# Patient Record
Sex: Female | Born: 1945 | Race: White | Hispanic: No | Smoking: Former smoker
Health system: Southern US, Community
[De-identification: ages and names within clinical notes are randomized; demographics above are authoritative.]

## PROBLEM LIST (undated history)

## (undated) DIAGNOSIS — K5903 Drug induced constipation: Secondary | ICD-10-CM

## (undated) DIAGNOSIS — K219 Gastro-esophageal reflux disease without esophagitis: Secondary | ICD-10-CM

## (undated) DIAGNOSIS — J449 Chronic obstructive pulmonary disease, unspecified: Secondary | ICD-10-CM

## (undated) DIAGNOSIS — R51 Headache: Secondary | ICD-10-CM

## (undated) DIAGNOSIS — F329 Major depressive disorder, single episode, unspecified: Secondary | ICD-10-CM

## (undated) DIAGNOSIS — F419 Anxiety disorder, unspecified: Secondary | ICD-10-CM

## (undated) DIAGNOSIS — I10 Essential (primary) hypertension: Secondary | ICD-10-CM

## (undated) DIAGNOSIS — D649 Anemia, unspecified: Secondary | ICD-10-CM

## (undated) DIAGNOSIS — R002 Palpitations: Secondary | ICD-10-CM

## (undated) DIAGNOSIS — Z9889 Other specified postprocedural states: Secondary | ICD-10-CM

## (undated) DIAGNOSIS — G4730 Sleep apnea, unspecified: Secondary | ICD-10-CM

## (undated) DIAGNOSIS — R1310 Dysphagia, unspecified: Secondary | ICD-10-CM

## (undated) DIAGNOSIS — M1990 Unspecified osteoarthritis, unspecified site: Secondary | ICD-10-CM

## (undated) DIAGNOSIS — R112 Nausea with vomiting, unspecified: Secondary | ICD-10-CM

## (undated) DIAGNOSIS — R519 Headache, unspecified: Secondary | ICD-10-CM

## (undated) DIAGNOSIS — Z8719 Personal history of other diseases of the digestive system: Secondary | ICD-10-CM

## (undated) DIAGNOSIS — H4450 Unspecified degenerated conditions of globe: Secondary | ICD-10-CM

## (undated) DIAGNOSIS — M797 Fibromyalgia: Secondary | ICD-10-CM

## (undated) DIAGNOSIS — G629 Polyneuropathy, unspecified: Secondary | ICD-10-CM

## (undated) DIAGNOSIS — R682 Dry mouth, unspecified: Secondary | ICD-10-CM

## (undated) DIAGNOSIS — H269 Unspecified cataract: Secondary | ICD-10-CM

## (undated) DIAGNOSIS — F32A Depression, unspecified: Secondary | ICD-10-CM

## (undated) HISTORY — PX: COLONOSCOPY: SHX174

## (undated) HISTORY — PX: ESOPHAGOGASTRODUODENOSCOPY ENDOSCOPY: SHX5814

## (undated) HISTORY — PX: ABDOMINAL HYSTERECTOMY: SHX81

## (undated) HISTORY — PX: CARDIAC CATHETERIZATION: SHX172

## (undated) HISTORY — PX: SINUS IRRIGATION: SHX2411

## (undated) HISTORY — PX: TONSILLECTOMY AND ADENOIDECTOMY: SUR1326

---

## 2015-09-04 ENCOUNTER — Encounter (HOSPITAL_COMMUNITY): Payer: Self-pay | Admitting: Emergency Medicine

## 2015-09-04 ENCOUNTER — Emergency Department (HOSPITAL_COMMUNITY)
Admission: EM | Admit: 2015-09-04 | Discharge: 2015-09-04 | Disposition: A | Discharge status: Home / Self Care | Payer: No Typology Code available for payment source | Attending: Emergency Medicine | Admitting: Emergency Medicine

## 2015-09-04 ENCOUNTER — Emergency Department (HOSPITAL_COMMUNITY): Payer: No Typology Code available for payment source

## 2015-09-04 DIAGNOSIS — I10 Essential (primary) hypertension: Secondary | ICD-10-CM | POA: Diagnosis not present

## 2015-09-04 DIAGNOSIS — S42351A Displaced comminuted fracture of shaft of humerus, right arm, initial encounter for closed fracture: Secondary | ICD-10-CM | POA: Insufficient documentation

## 2015-09-04 DIAGNOSIS — Z7951 Long term (current) use of inhaled steroids: Secondary | ICD-10-CM | POA: Insufficient documentation

## 2015-09-04 DIAGNOSIS — S0990XA Unspecified injury of head, initial encounter: Secondary | ICD-10-CM | POA: Diagnosis not present

## 2015-09-04 DIAGNOSIS — S42301A Unspecified fracture of shaft of humerus, right arm, initial encounter for closed fracture: Secondary | ICD-10-CM

## 2015-09-04 DIAGNOSIS — R109 Unspecified abdominal pain: Secondary | ICD-10-CM

## 2015-09-04 DIAGNOSIS — Y998 Other external cause status: Secondary | ICD-10-CM | POA: Insufficient documentation

## 2015-09-04 DIAGNOSIS — J449 Chronic obstructive pulmonary disease, unspecified: Secondary | ICD-10-CM | POA: Diagnosis not present

## 2015-09-04 DIAGNOSIS — T1490XA Injury, unspecified, initial encounter: Secondary | ICD-10-CM

## 2015-09-04 DIAGNOSIS — Z91040 Latex allergy status: Secondary | ICD-10-CM | POA: Insufficient documentation

## 2015-09-04 DIAGNOSIS — S79911A Unspecified injury of right hip, initial encounter: Secondary | ICD-10-CM | POA: Diagnosis not present

## 2015-09-04 DIAGNOSIS — S4991XA Unspecified injury of right shoulder and upper arm, initial encounter: Secondary | ICD-10-CM | POA: Diagnosis present

## 2015-09-04 DIAGNOSIS — Z79899 Other long term (current) drug therapy: Secondary | ICD-10-CM | POA: Insufficient documentation

## 2015-09-04 DIAGNOSIS — Y92410 Unspecified street and highway as the place of occurrence of the external cause: Secondary | ICD-10-CM | POA: Insufficient documentation

## 2015-09-04 DIAGNOSIS — Z8669 Personal history of other diseases of the nervous system and sense organs: Secondary | ICD-10-CM | POA: Diagnosis not present

## 2015-09-04 DIAGNOSIS — Y9389 Activity, other specified: Secondary | ICD-10-CM | POA: Diagnosis not present

## 2015-09-04 DIAGNOSIS — M25551 Pain in right hip: Secondary | ICD-10-CM

## 2015-09-04 HISTORY — DX: Sleep apnea, unspecified: G47.30

## 2015-09-04 HISTORY — DX: Chronic obstructive pulmonary disease, unspecified: J44.9

## 2015-09-04 HISTORY — DX: Essential (primary) hypertension: I10

## 2015-09-04 HISTORY — DX: Polyneuropathy, unspecified: G62.9

## 2015-09-04 LAB — COMPREHENSIVE METABOLIC PANEL
ALT: 18 U/L (ref 14–54)
AST: 25 U/L (ref 15–41)
Albumin: 3.6 g/dL (ref 3.5–5.0)
Alkaline Phosphatase: 81 U/L (ref 38–126)
Anion gap: 10 (ref 5–15)
BUN: 16 mg/dL (ref 6–20)
CO2: 23 mmol/L (ref 22–32)
Calcium: 9.8 mg/dL (ref 8.9–10.3)
Chloride: 106 mmol/L (ref 101–111)
Creatinine, Ser: 0.55 mg/dL (ref 0.44–1.00)
GFR calc Af Amer: >60 mL/min (ref >60)
GFR calc non Af Amer: >60 mL/min (ref >60)
Glucose, Bld: 113 mg/dL — ABNORMAL HIGH (ref 65–99)
Potassium: 3.9 mmol/L (ref 3.5–5.1)
Sodium: 139 mmol/L (ref 135–145)
Total Bilirubin: 0.5 mg/dL (ref 0.3–1.2)
Total Protein: 6.1 g/dL — ABNORMAL LOW (ref 6.5–8.1)

## 2015-09-04 LAB — CBC
HCT: 38.4 % (ref 36.0–46.0)
Hemoglobin: 13 g/dL (ref 12.0–15.0)
MCH: 30.1 pg (ref 26.0–34.0)
MCHC: 33.9 g/dL (ref 30.0–36.0)
MCV: 88.9 fL (ref 78.0–100.0)
Platelets: 220 10*3/uL (ref 150–400)
RBC: 4.32 MIL/uL (ref 3.87–5.11)
RDW: 12.4 % (ref 11.5–15.5)
WBC: 13.6 10*3/uL — ABNORMAL HIGH (ref 4.0–10.5)

## 2015-09-04 LAB — CDS SEROLOGY

## 2015-09-04 LAB — PROTIME-INR
INR: 1.03 (ref 0.00–1.49)
Prothrombin Time: 13.7 seconds (ref 11.6–15.2)

## 2015-09-04 LAB — ETHANOL: Alcohol, Ethyl (B): <5 mg/dL (ref <5)

## 2015-09-04 LAB — SAMPLE TO BLOOD BANK

## 2015-09-04 MED ORDER — FENTANYL CITRATE (PF) 100 MCG/2ML IJ SOLN
50.0000 ug | Freq: Once | INTRAMUSCULAR | Status: AC
  Administered 2015-09-04: 50 ug via INTRAVENOUS
  Filled 2015-09-04: qty 2

## 2015-09-04 MED ORDER — IOHEXOL 300 MG/ML  SOLN
100.0000 mL | Freq: Once | INTRAMUSCULAR | Status: AC | PRN
  Administered 2015-09-04: 100 mL via INTRAVENOUS

## 2015-09-04 MED ORDER — MORPHINE SULFATE (PF) 2 MG/ML IV SOLN
2.0000 mg | Freq: Once | INTRAVENOUS | Status: AC
  Administered 2015-09-04: 2 mg via INTRAVENOUS
  Filled 2015-09-04: qty 1

## 2015-09-04 MED ORDER — ONDANSETRON HCL 4 MG/2ML IJ SOLN
4.0000 mg | Freq: Once | INTRAMUSCULAR | Status: AC
  Administered 2015-09-04: 4 mg via INTRAVENOUS
  Filled 2015-09-04: qty 2

## 2015-09-04 MED ORDER — HYDROCODONE-ACETAMINOPHEN 5-325 MG PO TABS: 1.0000 | ORAL_TABLET | ORAL | Status: AC | PRN

## 2015-09-04 NOTE — Discharge Instructions (Signed)
Please use medication as directed. Please call Dr. Roda ShuttersXu Monday morning for follow-up evaluation and further management. If concerning signs or symptoms present please return to the ED for further evaluation and management.

## 2015-09-04 NOTE — ED Notes (Signed)
Patient transported to CT 

## 2015-09-04 NOTE — ED Notes (Signed)
PA Hedges at bedside to assess patient.
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
## 2015-09-04 NOTE — ED Notes (Signed)
Patient ambulated around room. Pt tolerated ambulation well, appeared steady on her feet.

## 2015-09-05 ENCOUNTER — Other Ambulatory Visit: Payer: Self-pay | Admitting: Orthopaedic Surgery

## 2015-09-05 ENCOUNTER — Ambulatory Visit
Admission: RE | Admit: 2015-09-05 | Discharge: 2015-09-05 | Disposition: A | Discharge status: Home / Self Care | Payer: Medicare Other | Source: Ambulatory Visit | Attending: Orthopaedic Surgery | Admitting: Orthopaedic Surgery

## 2015-09-05 ENCOUNTER — Other Ambulatory Visit (HOSPITAL_COMMUNITY): Payer: Self-pay | Admitting: Orthopaedic Surgery

## 2015-09-05 DIAGNOSIS — M25511 Pain in right shoulder: Secondary | ICD-10-CM

## 2015-09-06 ENCOUNTER — Encounter (HOSPITAL_COMMUNITY): Payer: Self-pay | Admitting: *Deleted

## 2015-09-06 MED ORDER — CEFAZOLIN SODIUM-DEXTROSE 2-3 GM-% IV SOLR
2.0000 g | INTRAVENOUS | Status: AC
  Administered 2015-09-07: 2 g via INTRAVENOUS
  Filled 2015-09-06: qty 50
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
## 2015-09-07 ENCOUNTER — Ambulatory Visit (HOSPITAL_COMMUNITY): Payer: No Typology Code available for payment source

## 2015-09-07 ENCOUNTER — Observation Stay (HOSPITAL_COMMUNITY)
Admission: RE | Admit: 2015-09-07 | Discharge: 2015-09-08 | Disposition: A | Discharge status: Home / Self Care | Payer: No Typology Code available for payment source | Source: Ambulatory Visit | Attending: Orthopaedic Surgery | Admitting: Orthopaedic Surgery

## 2015-09-07 ENCOUNTER — Encounter (HOSPITAL_COMMUNITY)
Admission: RE | Disposition: A | Discharge status: Home / Self Care | Payer: Self-pay | Source: Ambulatory Visit | Attending: Orthopaedic Surgery

## 2015-09-07 ENCOUNTER — Encounter (HOSPITAL_COMMUNITY): Payer: Self-pay | Admitting: General Practice

## 2015-09-07 ENCOUNTER — Ambulatory Visit (HOSPITAL_COMMUNITY): Payer: No Typology Code available for payment source | Admitting: Anesthesiology

## 2015-09-07 DIAGNOSIS — K219 Gastro-esophageal reflux disease without esophagitis: Secondary | ICD-10-CM | POA: Diagnosis not present

## 2015-09-07 DIAGNOSIS — Z8781 Personal history of (healed) traumatic fracture: Secondary | ICD-10-CM

## 2015-09-07 DIAGNOSIS — G4730 Sleep apnea, unspecified: Secondary | ICD-10-CM | POA: Diagnosis not present

## 2015-09-07 DIAGNOSIS — F329 Major depressive disorder, single episode, unspecified: Secondary | ICD-10-CM | POA: Diagnosis not present

## 2015-09-07 DIAGNOSIS — Y9389 Activity, other specified: Secondary | ICD-10-CM | POA: Diagnosis not present

## 2015-09-07 DIAGNOSIS — Z9889 Other specified postprocedural states: Secondary | ICD-10-CM

## 2015-09-07 DIAGNOSIS — I10 Essential (primary) hypertension: Secondary | ICD-10-CM | POA: Insufficient documentation

## 2015-09-07 DIAGNOSIS — J449 Chronic obstructive pulmonary disease, unspecified: Secondary | ICD-10-CM | POA: Diagnosis not present

## 2015-09-07 DIAGNOSIS — M797 Fibromyalgia: Secondary | ICD-10-CM | POA: Diagnosis not present

## 2015-09-07 DIAGNOSIS — F419 Anxiety disorder, unspecified: Secondary | ICD-10-CM | POA: Insufficient documentation

## 2015-09-07 DIAGNOSIS — S42291A Other displaced fracture of upper end of right humerus, initial encounter for closed fracture: Secondary | ICD-10-CM | POA: Diagnosis not present

## 2015-09-07 DIAGNOSIS — G43809 Other migraine, not intractable, without status migrainosus: Secondary | ICD-10-CM | POA: Insufficient documentation

## 2015-09-07 DIAGNOSIS — M79605 Pain in left leg: Secondary | ICD-10-CM | POA: Insufficient documentation

## 2015-09-07 DIAGNOSIS — Z87891 Personal history of nicotine dependence: Secondary | ICD-10-CM | POA: Diagnosis not present

## 2015-09-07 DIAGNOSIS — Z90710 Acquired absence of both cervix and uterus: Secondary | ICD-10-CM | POA: Diagnosis not present

## 2015-09-07 DIAGNOSIS — Y9289 Other specified places as the place of occurrence of the external cause: Secondary | ICD-10-CM | POA: Insufficient documentation

## 2015-09-07 DIAGNOSIS — M5136 Other intervertebral disc degeneration, lumbar region: Secondary | ICD-10-CM | POA: Diagnosis not present

## 2015-09-07 DIAGNOSIS — S42201A Unspecified fracture of upper end of right humerus, initial encounter for closed fracture: Secondary | ICD-10-CM | POA: Diagnosis present

## 2015-09-07 DIAGNOSIS — S42211A Unspecified displaced fracture of surgical neck of right humerus, initial encounter for closed fracture: Secondary | ICD-10-CM | POA: Diagnosis present

## 2015-09-07 DIAGNOSIS — M1990 Unspecified osteoarthritis, unspecified site: Secondary | ICD-10-CM | POA: Insufficient documentation

## 2015-09-07 DIAGNOSIS — K5730 Diverticulosis of large intestine without perforation or abscess without bleeding: Secondary | ICD-10-CM | POA: Insufficient documentation

## 2015-09-07 DIAGNOSIS — G629 Polyneuropathy, unspecified: Secondary | ICD-10-CM | POA: Insufficient documentation

## 2015-09-07 DIAGNOSIS — Y998 Other external cause status: Secondary | ICD-10-CM | POA: Insufficient documentation

## 2015-09-07 DIAGNOSIS — M542 Cervicalgia: Secondary | ICD-10-CM | POA: Insufficient documentation

## 2015-09-07 DIAGNOSIS — Z419 Encounter for procedure for purposes other than remedying health state, unspecified: Secondary | ICD-10-CM

## 2015-09-07 HISTORY — DX: Major depressive disorder, single episode, unspecified: F32.9

## 2015-09-07 HISTORY — PX: ORIF HUMERUS FRACTURE: SHX2126

## 2015-09-07 HISTORY — DX: Anemia, unspecified: D64.9

## 2015-09-07 HISTORY — DX: Dry mouth, unspecified: R68.2

## 2015-09-07 HISTORY — DX: Fibromyalgia: M79.7

## 2015-09-07 HISTORY — DX: Nausea with vomiting, unspecified: R11.2

## 2015-09-07 HISTORY — DX: Other specified postprocedural states: Z98.890

## 2015-09-07 HISTORY — DX: Unspecified cataract: H26.9

## 2015-09-07 HISTORY — DX: Headache, unspecified: R51.9

## 2015-09-07 HISTORY — DX: Unspecified osteoarthritis, unspecified site: M19.90

## 2015-09-07 HISTORY — DX: Dysphagia, unspecified: R13.10

## 2015-09-07 HISTORY — DX: Palpitations: R00.2

## 2015-09-07 HISTORY — DX: Anxiety disorder, unspecified: F41.9

## 2015-09-07 HISTORY — DX: Headache: R51

## 2015-09-07 HISTORY — DX: Depression, unspecified: F32.A

## 2015-09-07 HISTORY — DX: Unspecified degenerated conditions of globe: H44.50

## 2015-09-07 HISTORY — DX: Drug induced constipation: K59.03

## 2015-09-07 HISTORY — DX: Gastro-esophageal reflux disease without esophagitis: K21.9

## 2015-09-07 HISTORY — DX: Personal history of other diseases of the digestive system: Z87.19

## 2015-09-07 LAB — GLUCOSE, CAPILLARY: GLUCOSE-CAPILLARY: 96 mg/dL (ref 65–99)

## 2015-09-07 LAB — NO BLOOD PRODUCTS

## 2015-09-07 SURGERY — OPEN REDUCTION INTERNAL FIXATION (ORIF) PROXIMAL HUMERUS FRACTURE
Anesthesia: General | Site: Shoulder | Laterality: Right

## 2015-09-07 MED ORDER — MIDAZOLAM HCL 2 MG/2ML IJ SOLN
INTRAMUSCULAR | Status: AC
Start: 2015-09-07 — End: 2015-09-07
  Administered 2015-09-07: 2 mg
  Filled 2015-09-07: qty 2

## 2015-09-07 MED ORDER — FENTANYL CITRATE (PF) 100 MCG/2ML IJ SOLN
INTRAMUSCULAR | Status: AC
  Filled 2015-09-07: qty 2

## 2015-09-07 MED ORDER — MORPHINE SULFATE (PF) 2 MG/ML IV SOLN: 1.0000 mg | INTRAVENOUS | Status: DC | PRN

## 2015-09-07 MED ORDER — ONDANSETRON HCL 4 MG/2ML IJ SOLN
INTRAMUSCULAR | Status: DC | PRN
  Administered 2015-09-07: 4 mg via INTRAVENOUS

## 2015-09-07 MED ORDER — METOCLOPRAMIDE HCL 5 MG/ML IJ SOLN: 5.0000 mg | Freq: Three times a day (TID) | INTRAMUSCULAR | Status: DC | PRN

## 2015-09-07 MED ORDER — MIDAZOLAM HCL 2 MG/2ML IJ SOLN
INTRAMUSCULAR | Status: AC
  Filled 2015-09-07: qty 2

## 2015-09-07 MED ORDER — ONDANSETRON HCL 4 MG PO TABS: 4.0000 mg | ORAL_TABLET | Freq: Four times a day (QID) | ORAL | Status: DC | PRN

## 2015-09-07 MED ORDER — FENTANYL CITRATE (PF) 250 MCG/5ML IJ SOLN
INTRAMUSCULAR | Status: AC
  Filled 2015-09-07: qty 5

## 2015-09-07 MED ORDER — PANTOPRAZOLE SODIUM 40 MG PO TBEC
40.0000 mg | DELAYED_RELEASE_TABLET | Freq: Every day | ORAL | Status: DC
  Administered 2015-09-08: 40 mg via ORAL
  Filled 2015-09-07: qty 1

## 2015-09-07 MED ORDER — ONDANSETRON HCL 4 MG/2ML IJ SOLN: 4.0000 mg | Freq: Once | INTRAMUSCULAR | Status: DC | PRN

## 2015-09-07 MED ORDER — LACTATED RINGERS IV SOLN
INTRAVENOUS | Status: DC | PRN
  Administered 2015-09-07 (×2): via INTRAVENOUS

## 2015-09-07 MED ORDER — ALBUTEROL SULFATE (2.5 MG/3ML) 0.083% IN NEBU: 2.5000 mg | INHALATION_SOLUTION | Freq: Four times a day (QID) | RESPIRATORY_TRACT | Status: DC | PRN

## 2015-09-07 MED ORDER — FLUTICASONE PROPIONATE 50 MCG/ACT NA SUSP
1.0000 | Freq: Every day | NASAL | Status: DC
  Administered 2015-09-08: 1 via NASAL
  Filled 2015-09-07: qty 16

## 2015-09-07 MED ORDER — MONTELUKAST SODIUM 10 MG PO TABS
10.0000 mg | ORAL_TABLET | Freq: Every day | ORAL | Status: DC
  Filled 2015-09-07: qty 1

## 2015-09-07 MED ORDER — FENTANYL CITRATE (PF) 100 MCG/2ML IJ SOLN
INTRAMUSCULAR | Status: AC
  Administered 2015-09-07: 100 ug
  Filled 2015-09-07: qty 2

## 2015-09-07 MED ORDER — DIPHENHYDRAMINE HCL 12.5 MG/5ML PO ELIX: 25.0000 mg | ORAL_SOLUTION | ORAL | Status: DC | PRN

## 2015-09-07 MED ORDER — OXYCODONE HCL 5 MG/5ML PO SOLN: 5.0000 mg | Freq: Once | ORAL | Status: DC | PRN

## 2015-09-07 MED ORDER — PROPOFOL 10 MG/ML IV BOLUS
INTRAVENOUS | Status: DC | PRN
  Administered 2015-09-07: 120 mg via INTRAVENOUS

## 2015-09-07 MED ORDER — OXYCODONE HCL 5 MG PO TABS: 5.0000 mg | ORAL_TABLET | Freq: Once | ORAL | Status: DC | PRN

## 2015-09-07 MED ORDER — SODIUM CHLORIDE 0.9 % IR SOLN
Status: DC | PRN
  Administered 2015-09-07: 1000 mL

## 2015-09-07 MED ORDER — METOCLOPRAMIDE HCL 5 MG PO TABS: 5.0000 mg | ORAL_TABLET | Freq: Three times a day (TID) | ORAL | Status: DC | PRN

## 2015-09-07 MED ORDER — OXYCODONE HCL ER 10 MG PO T12A: 10.0000 mg | EXTENDED_RELEASE_TABLET | Freq: Two times a day (BID) | ORAL | Status: AC

## 2015-09-07 MED ORDER — PHENYLEPHRINE HCL 10 MG/ML IJ SOLN
10.0000 mg | INTRAVENOUS | Status: DC | PRN
  Administered 2015-09-07: 10 ug/min via INTRAVENOUS

## 2015-09-07 MED ORDER — LACTATED RINGERS IV SOLN
INTRAVENOUS | Status: DC
  Administered 2015-09-07: 11:00:00 via INTRAVENOUS

## 2015-09-07 MED ORDER — CO Q 10 10 MG PO CAPS: 10.0000 mg | ORAL_CAPSULE | Freq: Every day | ORAL | Status: DC

## 2015-09-07 MED ORDER — LOSARTAN POTASSIUM 50 MG PO TABS
50.0000 mg | ORAL_TABLET | Freq: Every day | ORAL | Status: DC
  Administered 2015-09-07 – 2015-09-08 (×2): 50 mg via ORAL
  Filled 2015-09-07 (×2): qty 1

## 2015-09-07 MED ORDER — LIDOCAINE HCL (CARDIAC) 20 MG/ML IV SOLN
INTRAVENOUS | Status: DC | PRN
  Administered 2015-09-07: 40 mg via INTRAVENOUS

## 2015-09-07 MED ORDER — OXYCODONE HCL 5 MG PO TABS: 5.0000 mg | ORAL_TABLET | ORAL | Status: AC | PRN

## 2015-09-07 MED ORDER — ROCURONIUM BROMIDE 100 MG/10ML IV SOLN
INTRAVENOUS | Status: DC | PRN
  Administered 2015-09-07: 40 mg via INTRAVENOUS

## 2015-09-07 MED ORDER — LIDOCAINE HCL (CARDIAC) 20 MG/ML IV SOLN
INTRAVENOUS | Status: AC
  Filled 2015-09-07: qty 5

## 2015-09-07 MED ORDER — SODIUM CHLORIDE 0.9 % IV SOLN
INTRAVENOUS | Status: DC
  Administered 2015-09-07: 125 mL/h via INTRAVENOUS

## 2015-09-07 MED ORDER — PROPOFOL 10 MG/ML IV BOLUS
INTRAVENOUS | Status: AC
  Filled 2015-09-07: qty 20

## 2015-09-07 MED ORDER — FENTANYL CITRATE (PF) 100 MCG/2ML IJ SOLN
INTRAMUSCULAR | Status: DC | PRN
  Administered 2015-09-07: 100 ug via INTRAVENOUS

## 2015-09-07 MED ORDER — 0.9 % SODIUM CHLORIDE (POUR BTL) OPTIME
TOPICAL | Status: DC | PRN
  Administered 2015-09-07: 1000 mL

## 2015-09-07 MED ORDER — ROCURONIUM BROMIDE 50 MG/5ML IV SOLN
INTRAVENOUS | Status: AC
Start: 2015-09-07 — End: 2015-09-07
  Filled 2015-09-07: qty 1

## 2015-09-07 MED ORDER — MIDAZOLAM HCL 2 MG/2ML IJ SOLN
INTRAMUSCULAR | Status: AC
  Filled 2015-09-07: qty 4

## 2015-09-07 MED ORDER — PRAVASTATIN SODIUM 20 MG PO TABS
20.0000 mg | ORAL_TABLET | Freq: Every day | ORAL | Status: DC
  Administered 2015-09-08: 20 mg via ORAL
  Filled 2015-09-07 (×2): qty 1

## 2015-09-07 MED ORDER — ONDANSETRON HCL 4 MG/2ML IJ SOLN
INTRAMUSCULAR | Status: AC
  Filled 2015-09-07: qty 2

## 2015-09-07 MED ORDER — PHENYLEPHRINE HCL 10 MG/ML IJ SOLN
INTRAMUSCULAR | Status: DC | PRN
  Administered 2015-09-07: 120 ug via INTRAVENOUS
  Administered 2015-09-07: 80 ug via INTRAVENOUS
  Administered 2015-09-07: 120 ug via INTRAVENOUS
  Administered 2015-09-07: 80 ug via INTRAVENOUS

## 2015-09-07 MED ORDER — LORATADINE 10 MG PO TABS
10.0000 mg | ORAL_TABLET | Freq: Every day | ORAL | Status: DC
Start: 2015-09-07 — End: 2015-09-07

## 2015-09-07 MED ORDER — FISH OIL 1000 MG PO CAPS: 1000.0000 mg | ORAL_CAPSULE | Freq: Every day | ORAL | Status: DC

## 2015-09-07 MED ORDER — CEFAZOLIN SODIUM-DEXTROSE 2-3 GM-% IV SOLR
2.0000 g | Freq: Four times a day (QID) | INTRAVENOUS | Status: AC
  Administered 2015-09-07 – 2015-09-08 (×3): 2 g via INTRAVENOUS
  Filled 2015-09-07 (×3): qty 50

## 2015-09-07 MED ORDER — ONDANSETRON HCL 4 MG/2ML IJ SOLN: 4.0000 mg | Freq: Four times a day (QID) | INTRAMUSCULAR | Status: DC | PRN

## 2015-09-07 MED ORDER — OMEGA-3-ACID ETHYL ESTERS 1 G PO CAPS: 1.0000 g | ORAL_CAPSULE | Freq: Every day | ORAL | Status: DC

## 2015-09-07 MED ORDER — ACETAMINOPHEN 325 MG PO TABS: 650.0000 mg | ORAL_TABLET | Freq: Four times a day (QID) | ORAL | Status: DC | PRN

## 2015-09-07 MED ORDER — FENTANYL CITRATE (PF) 100 MCG/2ML IJ SOLN: 25.0000 ug | INTRAMUSCULAR | Status: DC | PRN

## 2015-09-07 MED ORDER — AMLODIPINE BESYLATE 2.5 MG PO TABS
1.2500 mg | ORAL_TABLET | Freq: Every day | ORAL | Status: DC
  Administered 2015-09-08: 1.25 mg via ORAL
  Filled 2015-09-07: qty 1

## 2015-09-07 MED ORDER — ACETAMINOPHEN 650 MG RE SUPP: 650.0000 mg | Freq: Four times a day (QID) | RECTAL | Status: DC | PRN

## 2015-09-07 MED ORDER — OXYCODONE HCL 5 MG PO TABS
5.0000 mg | ORAL_TABLET | ORAL | Status: DC | PRN
  Administered 2015-09-07 – 2015-09-08 (×4): 10 mg via ORAL
  Filled 2015-09-07 (×4): qty 2

## 2015-09-07 MED ORDER — PHENYLEPHRINE 40 MCG/ML (10ML) SYRINGE FOR IV PUSH (FOR BLOOD PRESSURE SUPPORT)
PREFILLED_SYRINGE | INTRAVENOUS | Status: AC
  Filled 2015-09-07: qty 10

## 2015-09-07 SURGICAL SUPPLY — 82 items
BIT DRILL 3.2 (BIT) ×2
BIT DRILL 3.2XCALB NS DISP (BIT) ×1 IMPLANT
BIT DRILL CALIBRATED 2.7 (BIT) ×2 IMPLANT
BIT DRILL CALIBRATED 2.7MM (BIT) ×1
BIT DRL 3.2XCALB NS DISP (BIT) ×1
BLADE SURG 10 STRL SS (BLADE) IMPLANT
BNDG ESMARK 4X9 LF (GAUZE/BANDAGES/DRESSINGS) IMPLANT
CLOSURE STERI-STRIP 1/2X4 (GAUZE/BANDAGES/DRESSINGS) ×2
CLSR STERI-STRIP ANTIMIC 1/2X4 (GAUZE/BANDAGES/DRESSINGS) ×4 IMPLANT
COVER SURGICAL LIGHT HANDLE (MISCELLANEOUS) ×3 IMPLANT
CUFF TOURNIQUET SINGLE 18IN (TOURNIQUET CUFF) IMPLANT
CUFF TOURNIQUET SINGLE 24IN (TOURNIQUET CUFF) IMPLANT
DRAPE C-ARM 42X72 X-RAY (DRAPES) ×3 IMPLANT
DRAPE IMP U-DRAPE 54X76 (DRAPES) ×3 IMPLANT
DRAPE INCISE IOBAN 66X45 STRL (DRAPES) ×3 IMPLANT
DRAPE ORTHO SPLIT 77X108 STRL (DRAPES) ×2
DRAPE SURG ORHT 6 SPLT 77X108 (DRAPES) ×1 IMPLANT
DRAPE U-SHAPE 47X51 STRL (DRAPES) ×6 IMPLANT
DRSG MEPILEX BORDER 4X12 (GAUZE/BANDAGES/DRESSINGS) ×3 IMPLANT
DRSG TEGADERM 4X4.75 (GAUZE/BANDAGES/DRESSINGS) ×12 IMPLANT
ELECT CAUTERY BLADE 6.4 (BLADE) ×3 IMPLANT
ELECT REM PT RETURN 9FT ADLT (ELECTROSURGICAL) ×3
ELECTRODE REM PT RTRN 9FT ADLT (ELECTROSURGICAL) ×1 IMPLANT
FACESHIELD WRAPAROUND (MASK) ×3 IMPLANT
GAUZE SPONGE 4X4 12PLY STRL (GAUZE/BANDAGES/DRESSINGS) ×3 IMPLANT
GAUZE XEROFORM 5X9 LF (GAUZE/BANDAGES/DRESSINGS) ×3 IMPLANT
GLOVE BIOGEL PI IND STRL 7.0 (GLOVE) ×1 IMPLANT
GLOVE BIOGEL PI IND STRL 7.5 (GLOVE) ×1 IMPLANT
GLOVE BIOGEL PI INDICATOR 7.0 (GLOVE) ×2
GLOVE BIOGEL PI INDICATOR 7.5 (GLOVE) ×2
GLOVE NEODERM STRL 7.5 LF PF (GLOVE) ×2 IMPLANT
GLOVE SURG NEODERM 7.5  LF PF (GLOVE) ×4
GLOVE SURG SS PI 6.5 STRL IVOR (GLOVE) ×6 IMPLANT
GLOVE SURG SS PI 7.0 STRL IVOR (GLOVE) ×3 IMPLANT
GOWN STRL REIN XL XLG (GOWN DISPOSABLE) ×9 IMPLANT
GOWN STRL REUS W/ TWL LRG LVL3 (GOWN DISPOSABLE) ×2 IMPLANT
GOWN STRL REUS W/TWL LRG LVL3 (GOWN DISPOSABLE) ×4
K-WIRE 2X5 SS THRDED S3 (WIRE) ×6
KIT BASIN OR (CUSTOM PROCEDURE TRAY) ×3 IMPLANT
KIT ROOM TURNOVER OR (KITS) ×3 IMPLANT
KWIRE 2X5 SS THRDED S3 (WIRE) ×2 IMPLANT
MANIFOLD NEPTUNE II (INSTRUMENTS) ×3 IMPLANT
NDL SUT 2 .5 CRC MAYO 1.732X (NEEDLE) ×1 IMPLANT
NEEDLE MAYO TAPER (NEEDLE) ×2
NS IRRIG 1000ML POUR BTL (IV SOLUTION) ×3 IMPLANT
PACK SHOULDER (CUSTOM PROCEDURE TRAY) ×3 IMPLANT
PACK UNIVERSAL I (CUSTOM PROCEDURE TRAY) ×3 IMPLANT
PAD ARMBOARD 7.5X6 YLW CONV (MISCELLANEOUS) ×6 IMPLANT
PAD CAST 4YDX4 CTTN HI CHSV (CAST SUPPLIES) ×1 IMPLANT
PADDING CAST COTTON 4X4 STRL (CAST SUPPLIES) ×2
PEG LOCKING 3.2MMX44 (Peg) ×3 IMPLANT
PEG LOCKING 3.2MMX46 (Peg) ×3 IMPLANT
PEG LOCKING 3.2MMX56 (Peg) ×3 IMPLANT
PEG LOCKING 3.2X 28MM (Peg) ×3 IMPLANT
PEG LOCKING 3.2X36 (Screw) ×3 IMPLANT
PEG LOCKING 3.2X38 (Screw) ×3 IMPLANT
PEG LOCKING 3.2X50 (Screw) ×3 IMPLANT
PLATE PROX HUM HI R 4H 90 (Plate) ×3 IMPLANT
PUTTY DBM STAGRAFT PLUS 10CC (Putty) ×3 IMPLANT
PUTTY DBM STAGRAFT PLUS 2CC (Putty) ×3 IMPLANT
SCREW LP NL T15 3.5X22 (Screw) ×6 IMPLANT
SCREW LP NL T15 3.5X24 (Screw) ×6 IMPLANT
SCREW PEG LOCK 3.2X30MM (Screw) ×6 IMPLANT
SET CYSTO W/LG BORE CLAMP LF (SET/KITS/TRAYS/PACK) ×3 IMPLANT
SLEEVE MEASURING 3.2 (BIT) ×3 IMPLANT
SLING ARM IMMOBILIZER LRG (SOFTGOODS) ×3 IMPLANT
SPONGE LAP 18X18 X RAY DECT (DISPOSABLE) ×3 IMPLANT
STAPLER VISISTAT 35W (STAPLE) IMPLANT
SUCTION FRAZIER TIP 10 FR DISP (SUCTIONS) IMPLANT
SUT ETHILON 3 0 PS 1 (SUTURE) ×3 IMPLANT
SUT FIBERWIRE #2 38 T-5 BLUE (SUTURE) ×6
SUT MNCRL AB 4-0 PS2 18 (SUTURE) ×3 IMPLANT
SUT VIC AB 0 CT1 27 (SUTURE) ×2
SUT VIC AB 0 CT1 27XBRD ANBCTR (SUTURE) ×1 IMPLANT
SUT VIC AB 2-0 CT1 27 (SUTURE) ×4
SUT VIC AB 2-0 CT1 TAPERPNT 27 (SUTURE) ×2 IMPLANT
SUTURE FIBERWR #2 38 T-5 BLUE (SUTURE) ×2 IMPLANT
SYR CONTROL 10ML LL (SYRINGE) IMPLANT
TOWEL OR 17X24 6PK STRL BLUE (TOWEL DISPOSABLE) IMPLANT
TOWEL OR 17X26 10 PK STRL BLUE (TOWEL DISPOSABLE) ×3 IMPLANT
UNDERPAD 30X30 INCONTINENT (UNDERPADS AND DIAPERS) ×3 IMPLANT
WATER STERILE IRR 1000ML POUR (IV SOLUTION) ×3 IMPLANT

## 2015-09-07 NOTE — Anesthesia Preprocedure Evaluation (Signed)
Anesthesia Evaluation  Patient identified by MRN, date of birth, ID band Patient awake    Reviewed: Allergy & Precautions, NPO status , Patient's Chart, lab work & pertinent test results  Airway Mallampati: II  TM Distance: >3 FB Neck ROM: Full    Dental  (+) Teeth Intact, Dental Advisory Given   Pulmonary former smoker,    breath sounds clear to auscultation       Cardiovascular hypertension,  Rhythm:Regular Rate:Normal     Neuro/Psych    GI/Hepatic   Endo/Other    Renal/GU      Musculoskeletal   Abdominal   Peds  Hematology   Anesthesia Other Findings   Reproductive/Obstetrics                             Anesthesia Physical Anesthesia Plan  ASA: III  Anesthesia Plan: General   Post-op Pain Management: GA combined w/ Regional for post-op pain   Induction: Intravenous  Airway Management Planned: Oral ETT  Additional Equipment:   Intra-op Plan:   Post-operative Plan: Extubation in OR  Informed Consent: I have reviewed the patients History and Physical, chart, labs and discussed the procedure including the risks, benefits and alternatives for the proposed anesthesia with the patient or authorized representative who has indicated his/her understanding and acceptance.   Dental advisory given  Plan Discussed with: CRNA and Anesthesiologist  Anesthesia Plan Comments: (Fracture R. Humerus COPD Htn Palpitations H/O Post-op N/V  Plan GA with interscalene block  Kipp Broodavid Finlay Godbee )        Anesthesia Quick Evaluation

## 2015-09-07 NOTE — Discharge Instructions (Signed)
° °  Postoperative instructions: ° °Weightbearing: non weight bearing ° °Keep your dressing and/or splint clean and dry at all times.  You can remove your dressing on post-operative day #3 and change with a dry/sterile dressing or Band-Aids as needed thereafter.   ° °Incision instructions:  Do not soak your incision for 3 weeks after surgery.  If the incision gets wet, pat dry and do not scrub the incision. ° °Pain control:  You have been given a prescription to be taken as directed for post-operative pain control.  In addition, elevate the operative extremity above the heart at all times to prevent swelling and throbbing pain. ° °Take over-the-counter Colace, 100mg by mouth twice a day while taking narcotic pain medications to help prevent constipation. ° °Follow up appointments: °1) 10-14 days for suture removal and wound check. °2) Dr. Ansen Sayegh as scheduled. ° ° ------------------------------------------------------------------------------------------------------------- ° °After Surgery Pain Control: ° °After your surgery, post-surgical discomfort or pain is likely. This discomfort can last several days to a few weeks. At certain times of the day your discomfort may be more intense.  °Did you receive a nerve block?  °A nerve block can provide pain relief for one hour to two days after your surgery. As long as the nerve block is working, you will experience little or no sensation in the area the surgeon operated on.  °As the nerve block wears off, you will begin to experience pain or discomfort. It is very important that you begin taking your prescribed pain medication before the nerve block fully wears off. Treating your pain at the first sign of the block wearing off will ensure your pain is better controlled and more tolerable when full-sensation returns. Do not wait until the pain is intolerable, as the medicine will be less effective. It is better to treat pain in advance than to try and catch up.  °General  Anesthesia:  °If you did not receive a nerve block during your surgery, you will need to start taking your pain medication shortly after your surgery and should continue to do so as prescribed by your surgeon.  °Pain Medication:  °Most commonly we prescribe Vicodin and Percocet for post-operative pain. Both of these medications contain a combination of acetaminophen (Tylenol®) and a narcotic to help control pain.  °· It takes between 30 and 45 minutes before pain medication starts to work. It is important to take your medication before your pain level gets too intense.  °· Nausea is a common side effect of many pain medications. You will want to eat something before taking your pain medicine to help prevent nausea.  °· If you are taking a prescription pain medication that contains acetaminophen, we recommend that you do not take additional over the counter acetaminophen (Tylenol®).  °Other pain relieving options:  °· Using a cold pack to ice the affected area a few times a day (15 to 20 minutes at a time) can help to relieve pain, reduce swelling and bruising.  °· Elevation of the affected area can also help to reduce pain and swelling. ° ° ° °

## 2015-09-07 NOTE — H&P (Signed)
PREOPERATIVE H&P  Chief Complaint: Right proximal humerus fracture  HPI: Ellin Fitzgibbons is a 69 y.o. female who presents for surgical treatment of Right proximal humerus fracture.  She denies any changes in medical history.  Past Medical History  Diagnosis Date  . HTN (hypertension)   . COPD (chronic obstructive pulmonary disease) (HCC)   . Neuropathy (HCC)   . Heart palpitations   . Sleep apnea     uses cpap  . Depression   . Anxiety   . GERD (gastroesophageal reflux disease)   . History of hiatal hernia   . Headache     left sided migraines  . Arthritis     osteoarthritis, DDD  . Fibromyalgia   . Anemia   . Constipation due to pain medication   . Degeneration of eye     left vitrecular  . Cataracts, bilateral   . Difficulty swallowing   . Dry mouth   . PONV (postoperative nausea and vomiting)    Past Surgical History  Procedure Laterality Date  . Tonsillectomy and adenoidectomy    . Abdominal hysterectomy    . Sinus irrigation    . Cardiac catheterization    . Colonoscopy    . Esophagogastroduodenoscopy endoscopy     Social History   Social History  . Marital Status: Widowed    Spouse Name: N/A  . Number of Children: N/A  . Years of Education: N/A   Social History Main Topics  . Smoking status: Former Smoker    Quit date: 09/05/1988  . Smokeless tobacco: Never Used  . Alcohol Use: No  . Drug Use: No  . Sexual Activity: Not Asked   Other Topics Concern  . None   Social History Narrative   Family History  Problem Relation Age of Onset  . Hypertension Mother   . Congestive Heart Failure Mother   . Heart attack Father   . Emphysema Father   . Lung cancer Father    Allergies  Allergen Reactions  . Latex Itching  . Lisinopril     Cough   Prior to Admission medications   Medication Sig Start Date End Date Taking? Authorizing Provider  amLODipine (NORVASC) 2.5 MG tablet Take 1.25 mg by mouth daily.   Yes Historical Provider, MD  Coenzyme Q10  (CO Q 10) 10 MG CAPS Take 10 mg by mouth daily.   Yes Historical Provider, MD  fluticasone (FLONASE) 50 MCG/ACT nasal spray Place 1 spray into both nostrils daily.   Yes Historical Provider, MD  HYDROcodone-acetaminophen (NORCO/VICODIN) 5-325 MG tablet Take 1 tablet by mouth every 4 (four) hours as needed. 09/04/15  Yes Jeffrey Hedges, PA-C  losartan (COZAAR) 25 MG tablet Take 50 mg by mouth daily.    Yes Historical Provider, MD  montelukast (SINGULAIR) 10 MG tablet Take 10 mg by mouth at bedtime.   Yes Historical Provider, MD  Omega-3 Fatty Acids (FISH OIL) 1000 MG CAPS Take 1,000 mg by mouth daily.   Yes Historical Provider, MD  omeprazole (PRILOSEC) 20 MG capsule Take 20 mg by mouth daily.   Yes Historical Provider, MD  pravastatin (PRAVACHOL) 20 MG tablet Take 20 mg by mouth daily.   Yes Historical Provider, MD  albuterol (PROVENTIL HFA;VENTOLIN HFA) 108 (90 BASE) MCG/ACT inhaler Inhale 2 puffs into the lungs every 6 (six) hours as needed for wheezing or shortness of breath.    Historical Provider, MD  loratadine (CLARITIN) 10 MG tablet Take 10 mg by mouth daily.  Historical Provider, MD     Positive ROS: All other systems have been reviewed and were otherwise negative with the exception of those mentioned in the HPI and as above.  Physical Exam: General: Alert, no acute distress Cardiovascular: No pedal edema Respiratory: No cyanosis, no use of accessory musculature GI: abdomen soft Skin: No lesions in the area of chief complaint Neurologic: Sensation intact distally Psychiatric: Patient is competent for consent with normal mood and affect Lymphatic: no lymphedema  MUSCULOSKELETAL: exam stable  Assessment: Right proximal humerus fracture  Plan: Plan for Procedure(s): OPEN REDUCTION INTERNAL FIXATION (ORIF) RIGHT PROXIMAL HUMERUS FRACTURE  The risks benefits and alternatives were discussed with the patient including but not limited to the risks of nonoperative treatment,  versus surgical intervention including infection, bleeding, nerve injury,  blood clots, cardiopulmonary complications, morbidity, mortality, among others, and they were willing to proceed.   Cheral AlmasXu, Lalo Tromp Michael, MD   09/07/2015 11:00 AM

## 2015-09-07 NOTE — Transfer of Care (Signed)
Immediate Anesthesia Transfer of Care Note  Patient: Yvonne BeersMary Steelman  Procedure(s) Performed: Procedure(s): OPEN REDUCTION INTERNAL FIXATION RIGHT PROXIMAL HUMERUS FRACTURE (Right)  Patient Location: PACU  Anesthesia Type:General  Level of Consciousness: awake and alert   Airway & Oxygen Therapy: Patient Spontanous Breathing and Patient connected to nasal cannula oxygen  Post-op Assessment: Report given to RN  Post vital signs: Reviewed and stable  Last Vitals:  Filed Vitals:   09/07/15 1231  BP: 126/60  Pulse: 97  Resp: 20    Complications: No apparent anesthesia complications

## 2015-09-07 NOTE — Op Note (Signed)
   Date of Surgery: 09/07/2015  INDICATIONS: Ms. Yvonne Nichols is a 69 y.o.-year-old female with a right proximal humerus fracture;  The patient did consent to the procedure after discussion of the risks and benefits.  PREOPERATIVE DIAGNOSIS: Right proximal humerus fracture  POSTOPERATIVE DIAGNOSIS: Same.  PROCEDURE: Open reduction internal fixation of right proximal humerus fracture, surgical neck and greater tuberosity  SURGEON: N. Glee ArvinMichael Ted Leonhart, M.D.  ASSIST: April Chilton SiGreen, RNFA.  ANESTHESIA:  general, regional  IV FLUIDS AND URINE: See anesthesia.  ESTIMATED BLOOD LOSS: 100 mL.  IMPLANTS: Biomet ALPS Proximal humerus 4 hole high plate  DRAINS: none  COMPLICATIONS: None.  DESCRIPTION OF PROCEDURE: The patient was brought to the operating room and placed supine on the operating table.  The patient had been signed prior to the procedure and this was documented. The patient had the anesthesia placed by the anesthesiologist.  A time-out was performed to confirm that this was the correct patient, site, side and location. The patient did receive antibiotics prior to the incision and was re-dosed during the procedure as needed at indicated intervals.  A tourniquet not placed.  The patient had the operative extremity prepped and draped in the standard surgical fashion.    A deltopectoral approach to the shoulder was utilized. The cephalic vein was identified and retracted medially.  The deltoid muscle was then elevated off of the proximal humerus to expose the fracture. A small amount of the deltoid insertion had to be released in order to accommodate the plate. With the fracture exposed we were able to identify significant comminution of the surgical neck fracture with the humeral head in varus. The greater tuberosity was displaced posteriorly. We were able to obtain a reduction of the greater tuberosity to the shaft and the humeral head. Unfortunately we weren't able to take the humeral head out of all  the varus. With the fracture adequately reduced we placed a 4 hole proximal humerus plate on the lateral aspect of the proximal humerus. K wires were used to find the appropriate position with fluoroscopy. We then placed nonlocking screws in the shaft and locking pegs in the humeral head. We also used 2 #2 fiber wires to suture the greater tuberosity down to the plate.  Final x-rays were taken which confirmed appropriate reduction and placement of the plate. There was a significant amount of bone loss. We placed demineralized bone matrix in the bony void. The wound was thoroughly irrigated with normal saline and closed in a layered fashion using 0 Vicryl, 2-0 Vicryl, subcuticular 4-0 Monocryl. Sterile dressings were applied. Patient tolerated the procedure well was extubated and transferred to the PACU in stable condition.  POSTOPERATIVE PLAN: Patient will be admitted overnight for observation. She will be discharged in the morning.  Yvonne ReelN. Michael Saia Derossett, MD [facility] [phone] 4:35 PM

## 2015-09-07 NOTE — Anesthesia Procedure Notes (Addendum)
Procedure Name: Intubation Date/Time: 09/07/2015 2:17 PM Performed by: Rogelia BogaMUELLER, THOMAS P Pre-anesthesia Checklist: Patient identified, Emergency Drugs available, Suction available, Patient being monitored and Timeout performed Patient Re-evaluated:Patient Re-evaluated prior to inductionOxygen Delivery Method: Circle system utilized Preoxygenation: Pre-oxygenation with 100% oxygen Intubation Type: IV induction Laryngoscope Size: Mac and 4 Grade View: Grade I Tube type: Oral Tube size: 7.0 mm Number of attempts: 1 Airway Equipment and Method: Stylet Placement Confirmation: ETT inserted through vocal cords under direct vision,  positive ETCO2 and breath sounds checked- equal and bilateral Secured at: 21 cm Tube secured with: Tape Dental Injury: Teeth and Oropharynx as per pre-operative assessment    Anesthesia Regional Block:  Interscalene brachial plexus block  Pre-Anesthetic Checklist: ,, timeout performed, Correct Patient, Correct Site, Correct Laterality, Correct Procedure, Correct Position, site marked, Risks and benefits discussed,  Surgical consent,  Pre-op evaluation,  At surgeon's request and post-op pain management  Laterality: Right  Prep: chloraprep       Needles:  Injection technique: Single-shot  Needle Type: Echogenic Stimulator Needle     Needle Length: 9cm 9 cm Needle Gauge: 21 and 21 G    Additional Needles:  Procedures: ultrasound guided (picture in chart) Interscalene brachial plexus block Narrative:  Start time: 09/07/2015 1:50 PM End time: 09/07/2015 1:55 PM Injection made incrementally with aspirations every 5 mL.  Performed by: Personally   Additional Notes: 20 cc 0.5% Bupivacaine with 1:200 Epi injected easily

## 2015-09-08 ENCOUNTER — Encounter (HOSPITAL_COMMUNITY): Payer: Self-pay | Admitting: General Practice

## 2015-09-08 DIAGNOSIS — S42291A Other displaced fracture of upper end of right humerus, initial encounter for closed fracture: Secondary | ICD-10-CM | POA: Diagnosis not present

## 2015-09-08 NOTE — Discharge Planning (Signed)
IV removed. Discharge instructions and prescriptions given to patient and family. No questions or concerns expressed at this time. Patient discharged to home and transported via volunteer services and staff. All belongings sent with patient, including 3-in-1 bedside commode.

## 2015-09-08 NOTE — Evaluation (Signed)
Occupational Therapy Evaluation and Discharge Note Patient Details Name: Yvonne Nichols MRN: [id number] DOB: 07/20/1946 Today's Date: 09/08/2015    History of Present Illness 69 y.o. female who presents for surgical treatment of Right proximal humerus fracture. S/p Open reduction internal fixation of right proximal humerus fracture, surgical neck and greater tuberosity.    Clinical Impression   Pt reports she was independent with ADLs PTA. Currently pt is min A overall for ADLs and supervision for functional mobility. All shoulder and ADL education completed; pt with no further questions or concerns for OT at this time. Pt plan to d/c home with 24/7 supervision from friends. Pt ready to d/c home from an OT standpoint; signing off at this time. Thank you for this referral.     Follow Up Recommendations  Supervision/Assistance - 24 hour (follow up per MD)    Equipment Recommendations  3 in 1 bedside comode    Recommendations for Other Services       Precautions / Restrictions Precautions Precautions: Shoulder Type of Shoulder Precautions: NO ROM of shoulder. AROM elbow, wrist, hand OK. Shoulder Interventions: Shoulder sling/immobilizer;At all times Precaution Booklet Issued: Yes (comment) Precaution Comments: Reviewed precautions. Required Braces or Orthoses: Sling Restrictions Weight Bearing Restrictions: Yes RUE Weight Bearing: Non weight bearing      Mobility Bed Mobility Overal bed mobility: Needs Assistance Bed Mobility: Supine to Sit     Supine to sit: Supervision     General bed mobility comments: Supervision for safety. Increased time required. VC for NWB through RUE.   Transfers Overall transfer level: Needs assistance Equipment used: None Transfers: Sit to/from Stand Sit to Stand: Supervision         General transfer comment: Supervision for safety. Pt with good technique. VC for hand placement. Sit <> stand from EOB x 2, BSC x 1    Balance Overall  balance assessment: Modified Independent (No LOB noted during mobility)                                          ADL Overall ADL's : Needs assistance/impaired     Grooming: Supervision/safety;Standing   Upper Body Bathing: Minimal assitance;Sitting   Lower Body Bathing: Minimal assistance;Sit to/from stand   Upper Body Dressing : Minimal assistance;Sitting   Lower Body Dressing: Minimal assistance;Sit to/from stand   Toilet Transfer: Supervision/safety   Toileting- ArchitectClothing Manipulation and Hygiene: Supervision/safety;Sit to/from stand       Functional mobility during ADLs: Supervision/safety General ADL Comments: Close friend present for OT eval. Educated pt on UB bathing/dressing technique, proper positioning of RUE, sling management and wear schedule, edema management techniques, need for close supervision during ADLs and functional mobility at home, home safety; pt verbalized understanding.     Vision     Perception     Praxis      Pertinent Vitals/Pain Pain Assessment: Faces Faces Pain Scale: Hurts little more Pain Location: R shoulder Pain Descriptors / Indicators: Sore Pain Intervention(s): Limited activity within patient's tolerance;Monitored during session;Repositioned;Ice applied     Hand Dominance Right   Extremity/Trunk Assessment Upper Extremity Assessment Upper Extremity Assessment: RUE deficits/detail RUE Deficits / Details: Decreased elbow ROM, potentially due to edema. Wrist and hand ROM WFL. Pt reports slight tingling in R upper arm  RUE: Unable to fully assess due to pain;Unable to fully assess due to immobilization   Lower Extremity Assessment Lower Extremity Assessment:  Overall [facility] for tasks assessed   Cervical / Trunk Assessment Cervical / Trunk Assessment: Normal   Communication Communication Communication: No difficulties   Cognition Arousal/Alertness: Awake/alert Behavior During Therapy: WFL for tasks  assessed/performed Overall Cognitive Status: Within Functional Limits for tasks assessed                     General Comments       Exercises Exercises: Shoulder     Shoulder Instructions Shoulder Instructions Donning/doffing shirt without moving shoulder: Minimal assistance;Caregiver independent with task;Patient able to independently direct caregiver (education completed) Method for sponge bathing under operated UE: Minimal assistance;Caregiver independent with task;Patient able to independently direct caregiver (education completed) Donning/doffing sling/immobilizer: Caregiver independent with task;Patient able to independently direct caregiver;Moderate assistance (education completed) Correct positioning of sling/immobilizer: Minimal assistance;Patient able to independently direct caregiver;Caregiver independent with task (education completed) ROM for elbow, wrist and digits of operated UE: Supervision/safety;Caregiver independent with task;Patient able to independently direct caregiver (education completed) Sling wearing schedule (on at all times/off for ADL's): Supervision/safety;Caregiver independent with task;Patient able to independently direct caregiver (education completed) Proper positioning of operated UE when showering: Minimal assistance;Caregiver independent with task;Patient able to independently direct caregiver (education completed) Positioning of UE while sleeping: Minimal assistance;Caregiver independent with task;Patient able to independently direct caregiver (education completed)    Home Living Family/patient expects to be discharged to:: Private residence Living Arrangements: Alone Available Help at Discharge: Friend(s);Available 24 hours/day Type of Home: Apartment Home Access: Level entry     Home Layout: One level     Bathroom Shower/Tub: Tub/shower unit Shower/tub characteristics: Engineer, building services: Standard     Home Equipment: None           Prior Functioning/Environment Level of Independence: Independent             OT Diagnosis: Acute pain   OT Problem List:     OT Treatment/Interventions:      OT Goals(Current goals can be found in the care plan section) Acute Rehab OT Goals Patient Stated Goal: to get back to being independent OT Goal Formulation: With patient  OT Frequency:     Barriers to D/C:            Co-evaluation              End of Session Equipment Utilized During Treatment: Other (comment) (sling)  Activity Tolerance: Patient tolerated treatment well Patient left: in chair;with call bell/phone within reach;with family/visitor present   Time: 4098-1191 OT Time Calculation (min): 43 min Charges:  OT General Charges $OT Visit: 1 Procedure OT Evaluation $Initial OT Evaluation Tier I: 1 Procedure OT Treatments $Self Care/Home Management : 8-22 mins $Therapeutic Exercise: 8-22 mins G-Codes: OT G-codes **NOT FOR INPATIENT CLASS** Functional Assessment Tool Used: Clinical judgement Functional Limitation: Self care Self Care Current Status (Y7829): At least 1 percent but less than 20 percent impaired, limited or restricted Self Care Goal Status (F6213): At least 1 percent but less than 20 percent impaired, limited or restricted Self Care Discharge Status [phone]): At least 1 percent but less than 20 percent impaired, limited or restricted   Gaye Alken M.S., OTR/L Pager: [phone]  09/08/2015, 9:34 AM
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
## 2015-09-08 NOTE — Progress Notes (Signed)
   Subjective:  Patient reports pain as mild.    Objective:   VITALS:   Filed Vitals:   09/07/15 1840 09/07/15 2032 09/08/15 0042 09/08/15 0514  BP: 135/67 127/55 107/57 119/52  Pulse: 1 85 79 89  Temp: 97.1 F (36.2 C) 98.3 F (36.8 C) 98.2 F (36.8 C) 99 F (37.2 C)  TempSrc: Oral Oral Oral Oral  Resp: 18 18 16 16   Height:      Weight:      SpO2: 99% 100% 98% 96%    Neurologically intact Neurovascular intact Sensation intact distally Intact pulses distally   Lab Results  Component Value Date   WBC 13.6* 09/04/2015   HGB 13.0 09/04/2015   HCT 38.4 09/04/2015   MCV 88.9 09/04/2015   PLT 220 09/04/2015     Assessment/Plan:  1 Day Post-Op   - up with OT - home after OT - Rx in chart  Cheral AlmasXu, Niraj Kudrna Michael 09/08/2015, 7:42 AM [phone]

## 2015-09-08 NOTE — Care Management Note (Signed)
Case Management Note  Patient Details  Name: Dinah BeersMary Mccutchen MRN: [id number] Date of Birth: 06/25/1946  Subjective/Objective:      69 yr old female s/p ORIF of right humerus fracture              Action/Plan: Case manager arranged for DME. No further case management needs identified.   Expected Discharge Date:   09/08/15               Expected Discharge Plan:  Home/Self Care  In-House Referral:  NA  Discharge planning Services  CM Consult  Post Acute Care Choice:  Durable Medical Equipment Choice offered to:  NA  DME Arranged:  3-N-1 DME Agency:  [facility]  HH Arranged:  NA HH Agency:  NA  Status of Service:  Completed, signed off  Medicare Important Message Given:    Date Medicare IM Given:    Medicare IM give by:    Date Additional Medicare IM Given:    Additional Medicare Important Message give by:     If discussed at Long Length of Stay Meetings, dates discussed:    Additional Comments:  Durenda GuthrieBrady, Adya Wirz Naomi, RN 09/08/2015, 12:17 PM

## 2015-09-23 NOTE — Anesthesia Postprocedure Evaluation (Signed)
  Anesthesia Post-op Note  Patient: Yvonne Nichols  Procedure(s) Performed: Procedure(s): OPEN REDUCTION INTERNAL FIXATION RIGHT PROXIMAL HUMERUS FRACTURE (Right)  Patient Location: PACU  Anesthesia Type:General and GA combined with regional for post-op pain  Level of Consciousness: awake, alert  and oriented  Airway and Oxygen Therapy: Patient Spontanous Breathing and Patient connected to nasal cannula oxygen  Post-op Pain: none  Post-op Assessment: Post-op Vital signs reviewed, Patient's Cardiovascular Status Stable, Respiratory Function Stable, Patent Airway and Pain level controlled              Post-op Vital Signs: stable  Last Vitals:  Filed Vitals:   09/08/15 0514  BP: 119/52  Pulse: 89  Temp: 37.2 C  Resp: 16    Complications: No apparent anesthesia complications

## 2016-05-09 IMAGING — CT CT SHOULDER*R* W/O CM
3 series · 8 of 14 positions shown, 9 images · non-contrast
Comparison: Radiographs dated 09/04/2015

CLINICAL DATA: Comminuted right humeral head fracture.

EXAM:
CT OF THE RIGHT SHOULDER WITHOUT CONTRAST; 3-DIMENSIONAL CT IMAGE
RENDERING ON INDEPENDENT WORKSTATION
TECHNIQUE: Multidetector CT imaging was performed according to the standard
protocol. Multiplanar CT image reconstructions were also generated.

[Series 3: soft tissue · axial · 0.47mm/px · z∈[-187,-121]mm · 2 of 68 slices shown]
[im 23/68  soft-tissue]
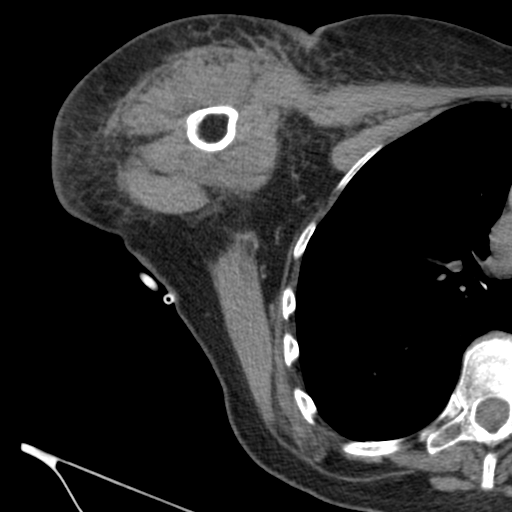
[im 45/68  soft-tissue]
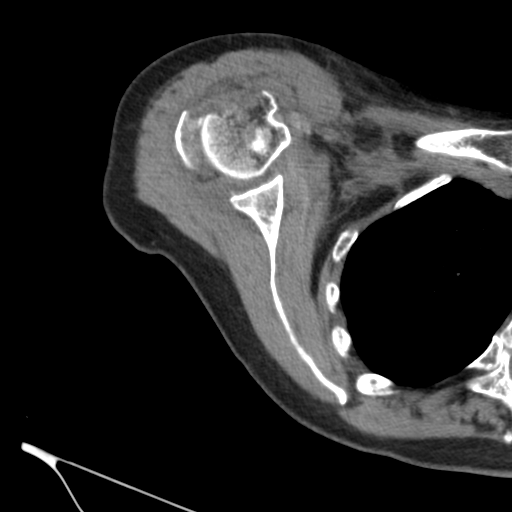

[Series 602: axial soft · axial · 0.47mm/px · z∈[-192,-100]mm · 3 of 93 slices shown]
[im 24/93  soft-tissue]
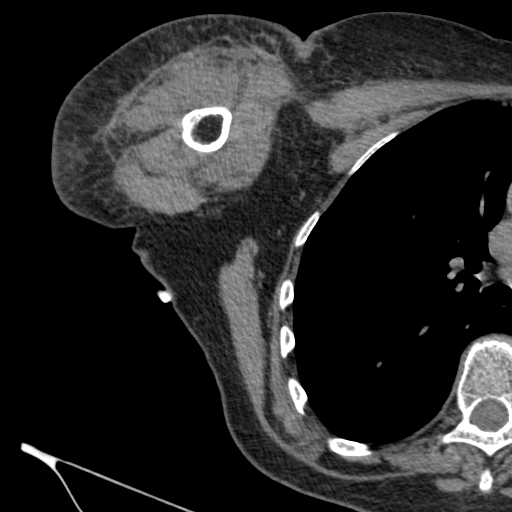
[im 47/93  soft-tissue]
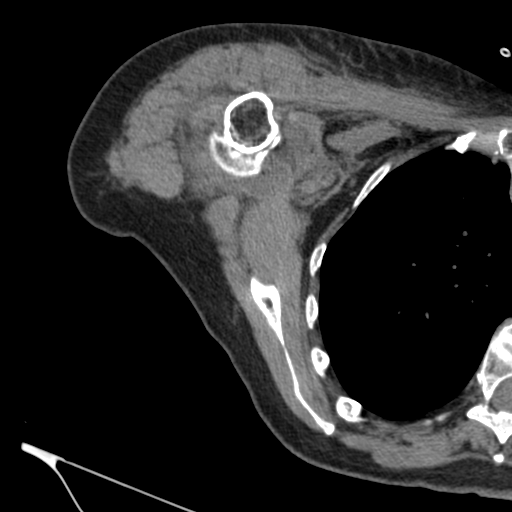
[im 70/93  soft-tissue]
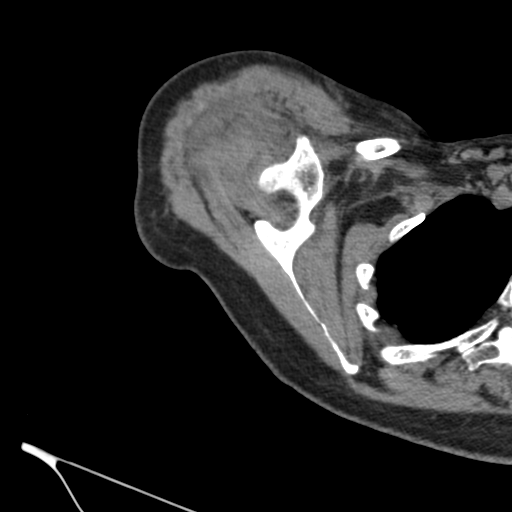

[Series 603: axial bone · axial · 0.47mm/px · z∈[-191,-107]mm · 3 of 85 slices shown, 4 images]
[im 22/85  soft-tissue]
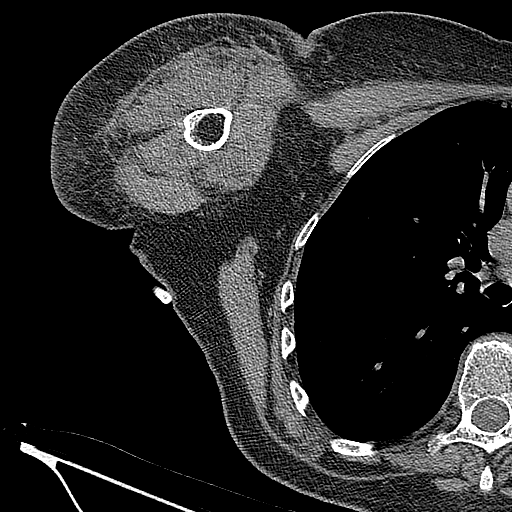
[im 22/85  bone]
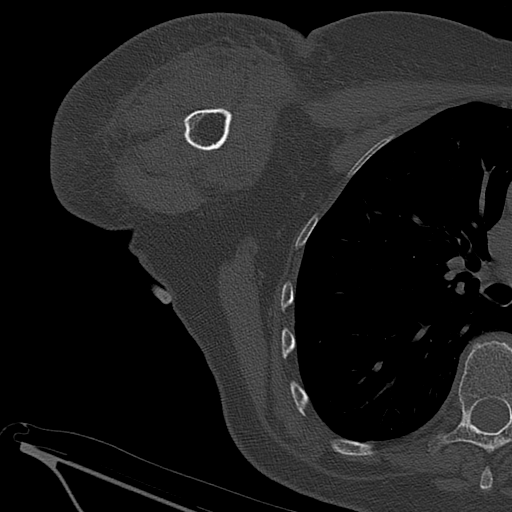
[im 43/85  bone]
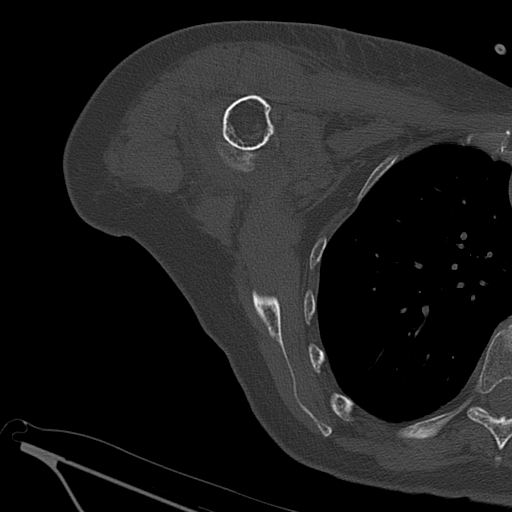
[im 64/85  bone]
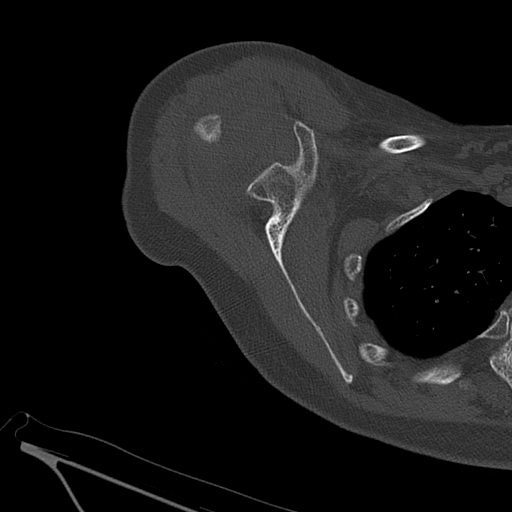

[8 of 14 positions shown; findings below may reference images not displayed]

FINDINGS: The greater trochanter of the proximal humerus is completely avulsed
including the attachments of the teres minor, infraspinatus and
supraspinous tendons. The greater trochanter is posteriorly
displaced approximately 2.7 cm. There is an impaction fracture of
the left humeral neck just under the humeral head with the anterior
impaction being greater than the posterior impaction in the majority
of the humeral head is anteriorly rotated with respect to the shaft.

There is also slight impaction of the anterior medial aspect of the
humeral head just medial to the lesser tuberosity best seen on axial
images.

The long head of the biceps tendon is properly located and intact.

The scapula and distal clavicle and adjacent ribs are intact. The
humeral head is subluxed inferiorly due to a hemarthrosis. There is
bleeding into the adjacent subcutaneous fat and into the adjacent
muscles.
IMPRESSION: Comminuted impacted angulated fracture of the humeral head and shaft
as described.

The 3D images better demonstrate the relationship of fracture
fragments to one another.
